# Patient Record
Sex: Female | Born: 1995 | Race: White | Hispanic: No | Marital: Single | Smoking: Never smoker
Health system: Southern US, Community
[De-identification: ages and names within clinical notes are randomized; demographics above are authoritative.]

## PROBLEM LIST (undated history)

## (undated) DIAGNOSIS — E282 Polycystic ovarian syndrome: Secondary | ICD-10-CM

## (undated) DIAGNOSIS — F419 Anxiety disorder, unspecified: Secondary | ICD-10-CM

## (undated) DIAGNOSIS — F32A Depression, unspecified: Secondary | ICD-10-CM

---

## 2019-09-12 ENCOUNTER — Ambulatory Visit: Payer: Self-pay | Attending: Internal Medicine

## 2021-01-08 ENCOUNTER — Emergency Department (HOSPITAL_COMMUNITY)
Admission: EM | Admit: 2021-01-08 | Discharge: 2021-01-08 | Disposition: A | Discharge status: Home / Self Care | Payer: 59 | Attending: Emergency Medicine | Admitting: Emergency Medicine

## 2021-01-08 ENCOUNTER — Encounter (HOSPITAL_COMMUNITY): Payer: Self-pay

## 2021-01-08 ENCOUNTER — Emergency Department (HOSPITAL_COMMUNITY): Payer: 59

## 2021-01-08 ENCOUNTER — Other Ambulatory Visit: Payer: Self-pay

## 2021-01-08 DIAGNOSIS — W109XXA Fall (on) (from) unspecified stairs and steps, initial encounter: Secondary | ICD-10-CM | POA: Diagnosis not present

## 2021-01-08 DIAGNOSIS — M25571 Pain in right ankle and joints of right foot: Secondary | ICD-10-CM | POA: Insufficient documentation

## 2021-01-08 HISTORY — DX: Anxiety disorder, unspecified: F41.9

## 2021-01-08 HISTORY — DX: Depression, unspecified: F32.A

## 2021-01-08 HISTORY — DX: Polycystic ovarian syndrome: E28.2

## 2021-01-08 NOTE — ED Triage Notes (Signed)
Patient reports that she stepped off of the steps today and heard a loud crack . Patient c/o right ankle pain. Patient states she was able to ambulate on it, but now it is too painful.

## 2021-01-08 NOTE — ED Notes (Signed)
Called ortho tech for crutches and cam boot

## 2021-01-08 NOTE — ED Provider Notes (Signed)
[facility]-EMERGENCY DEPT Provider Note   CSN: [id number] Arrival date & time: 01/08/21  1132     History Chief Complaint  Patient presents with  . Ankle Pain    Alexa Herrera is a 25 y.o. female with history of anxiety, depression, PCOS.  Patient presents with chief complaint of right ankle pain.  Patient reports that she was coming down a set of steps when she twisted her right ankle and heard a "crack."  Patient denies falling or hitting her head.  Patient reports that she was able to sit down after the fall.  She was ambulatory after the fall.  Patient endorses pain to her right ankle and foot.  Patient rates her pain 3/10 on pain scale.  Pain is worse with movement.  Patient denies any numbness, weakness, or color change to affected foot.   HPI     Past Medical History:  Diagnosis Date  . Anxiety   . Depression   . PCOS (polycystic ovarian syndrome)     There are no problems to display for this patient.   History reviewed. No pertinent surgical history.   OB History   No obstetric history on file.     Family History  Problem Relation Age of Onset  . Anxiety disorder Mother     Social History   Tobacco Use  . Smoking status: Never Smoker  . Smokeless tobacco: Never Used  Vaping Use  . Vaping Use: Every day  . Substances: Nicotine, Flavoring  Substance Use Topics  . Alcohol use: Never  . Drug use: Never    Home Medications Prior to Admission medications   Not on File    Allergies    Patient has no known allergies.  Review of Systems   Review of Systems  Musculoskeletal: Positive for arthralgias and joint swelling. Negative for back pain, gait problem, myalgias and neck pain.  Skin: Negative for color change, pallor and rash.  Neurological: Negative for weakness and numbness.    Physical Exam Updated Vital Signs BP (!) 120/93 (BP Location: Left Arm)   Pulse (!) 107   Temp 97.9 F (36.6 C) (Oral)   Resp 18   Ht 5'  9" (1.753 m)   Wt 113.4 kg   LMP 10/11/2020 (Approximate) Comment: irregular periods  SpO2 98%   BMI 36.92 kg/m   Physical Exam Vitals and nursing note reviewed.  Constitutional:      General: She is not in acute distress.    Appearance: She is not ill-appearing, toxic-appearing or diaphoretic.  HENT:     Head: Normocephalic.  Eyes:     General: No scleral icterus.       Right eye: No discharge.        Left eye: No discharge.  Cardiovascular:     Rate and Rhythm: Normal rate.  Pulmonary:     Effort: Pulmonary effort is normal. No respiratory distress.     Breath sounds: No stridor.  Musculoskeletal:     Right ankle: No swelling, deformity, ecchymosis or lacerations. Tenderness present. Normal range of motion. Normal pulse.     Right Achilles Tendon: Normal.     Left ankle: No swelling, deformity, ecchymosis or lacerations. No tenderness. Normal range of motion. Normal pulse.     Left Achilles Tendon: Normal.     Right foot: Normal range of motion and normal capillary refill. Tenderness present. No swelling, deformity, laceration or bony tenderness. Normal pulse.     Left foot: Normal range  of motion and normal capillary refill. No swelling, deformity, laceration, tenderness or bony tenderness. Normal pulse.     Comments: Patient has tenderness to right anterior ankle as well as dorsum of right foot.  Minimal swelling noted to lateral aspect of right ankle.  Patient has +5 strength to dorsiflexion and plantar flexion bilaterally.  +3 dorsalis pedis pulse bilaterally.  Sensation intact to all digits of right foot.  Cap refill less than 2 seconds in all digits of right foot.  Skin:    General: Skin is warm and dry.  Neurological:     General: No focal deficit present.     Mental Status: She is alert.  Psychiatric:        Behavior: Behavior is cooperative.     ED Results / Procedures / Treatments   Labs (all labs ordered are listed, but only abnormal results are  displayed) Labs Reviewed - No data to display  EKG None  Radiology DG Ankle Complete Right  Result Date: 01/08/2021 CLINICAL DATA:  Right foot and ankle pain. EXAM: RIGHT ANKLE - COMPLETE 3+ VIEW COMPARISON:  None. FINDINGS: Mild lateral soft tissue swelling. No visible fracture or dislocation IMPRESSION: Mild lateral soft tissue swelling.  Otherwise negative. Electronically Signed   By: Paulina Fusi M.D.   On: 01/08/2021 13:31   DG Foot Complete Right  Result Date: 01/08/2021 CLINICAL DATA:  Twisting injury today.  Pain. EXAM: RIGHT FOOT COMPLETE - 3+ VIEW COMPARISON:  None. FINDINGS: There is no evidence of fracture or dislocation. There is no evidence of arthropathy or other focal bone abnormality. Soft tissues are unremarkable. IMPRESSION: Normal radiographs. Electronically Signed   By: Paulina Fusi M.D.   On: 01/08/2021 13:32    Procedures Procedures   Medications Ordered in ED Medications - No data to display  ED Course  I have reviewed the triage vital signs and the nursing notes.  Pertinent labs & imaging results that were available during my care of the patient were reviewed by me and considered in my medical decision making (see chart for details).    MDM Rules/Calculators/A&P                          Alert 25 year old female no acute distress, nontoxic-appearing.  Patient presents with chief complaint of right ankle and foot pain.  Patient reports that she was coming down a set of steps when she rolled her ankle and heard "a crack.".  On physical exam patient has minimal swelling to lateral aspect of her ankle.  Pulse, motor, and sensation intact to right foot.  No color change, wound, or ecchymosis observed.  Will give patient Percocet for pain management.  Will obtain x-ray imaging of right foot and ankle.  X-ray imaging of right foot and ankle shows no acute fracture or dislocation.  Will place patient in cam walking boot, give her crutches, and have her follow-up  with orthopedic physician.  Patient advised to rest, elevate, and ice ankle to reduce swelling.  Discussed results, findings, treatment and follow up. Patient advised of return precautions. Patient verbalized understanding and agreed with plan.   Final Clinical Impression(s) / ED Diagnoses Final diagnoses:  Acute right ankle pain    Rx / DC Orders ED Discharge Orders    None       Haskel Schroeder, PA-C 01/09/21 0038    Mancel Bale, MD 01/09/21 [phone]

## 2021-01-08 NOTE — Progress Notes (Signed)
Orthopedic Tech Progress Note Patient Details:  Alexa Herrera Nov 21, 1995 [id number]  Ortho Devices Type of Ortho Device: Crutches,CAM walker Ortho Device/Splint Location: right Ortho Device/Splint Interventions: Application   Post Interventions Patient Tolerated: Well Instructions Provided: Care of device   Saul Fordyce 01/08/2021, 2:00 PM

## 2021-01-08 NOTE — Discharge Instructions (Signed)
You came to the emerge department today to be evaluated for your right ankle and foot pain.  Your x-ray showed no broken bones or dislocations.  I have placed you in a cam walking boot and given you crutches.  Please stay off of your foot as much as possible.  Please elevate and ice your foot to reduce swelling.  Please follow-up with orthopedic surgeon.  Please take Ibuprofen (Advil, motrin) and Tylenol (acetaminophen) to relieve your pain.    You may take up to 600 MG (3 pills) of normal strength ibuprofen every 8 hours as needed.   You make take tylenol, up to 1,000 mg (two extra strength pills) every 8 hours as needed.   It is safe to take ibuprofen and tylenol at the same time as they work differently.   Do not take more than 3,000 mg tylenol in a 24 hour period (not more than one dose every 8 hours.  Please check all medication labels as many medications such as pain and cold medications may contain tylenol.  Do not drink alcohol while taking these medications.  Do not take other NSAID'S while taking ibuprofen (such as aleve or naproxen).  Please take ibuprofen with food to decrease stomach upset.  Today you received medications that may make you sleepy or impair your ability to make decisions.  For the next 24 hours please do not drive, operate heavy machinery, care for a small child with out another adult present, or perform any activities that may cause harm to you or someone else if you were to fall asleep or be impaired.    Get help right away if: Your foot, leg, toes, or ankle: Becomes numb. Becomes swollen. Turns pale or blue.

## 2021-01-08 NOTE — ED Provider Notes (Signed)
Emergency Medicine Provider Triage Evaluation Note  Alexa Herrera , a 25 y.o. female  was evaluated in triage.  Pt complains of stepped off of a stair, twisted right ankle and heard a "crack."  Patient was ambulatory after injury.     Review of Systems  Positive: Right ankle pain Negative: Numbness, weakness   Physical Exam  BP (!) 120/93 (BP Location: Left Arm)   Pulse (!) 107   Temp 97.9 F (36.6 C) (Oral)   Resp 18   Ht 5\' 9"  (1.753 m)   Wt 113.4 kg   SpO2 98%   BMI 36.92 kg/m  Gen:   Awake, no distress   Resp:  Normal effort  MSK:   Moves extremities without difficulty  Other:  PMS intact to right foot  Medical Decision Making  Medically screening exam initiated at 12:37 PM.  Appropriate orders placed.  Alexa Herrera was informed that the remainder of the evaluation will be completed by another provider, this initial triage assessment does not replace that evaluation, and the importance of remaining in the ED until their evaluation is complete.  The patient appears stable so that the remainder of the work up may be completed by another provider.      Gearldine Shown, PA-C 01/08/21 1241    03/10/21, MD 01/08/21 [phone]

## 2022-07-14 IMAGING — CR DG FOOT COMPLETE 3+V*R*
3 series · 3 of 3 positions shown · non-contrast
Comparison: None.

CLINICAL DATA: Twisting injury today.  Pain.

EXAM:
RIGHT FOOT COMPLETE - 3+ VIEW

[x foot ap right]
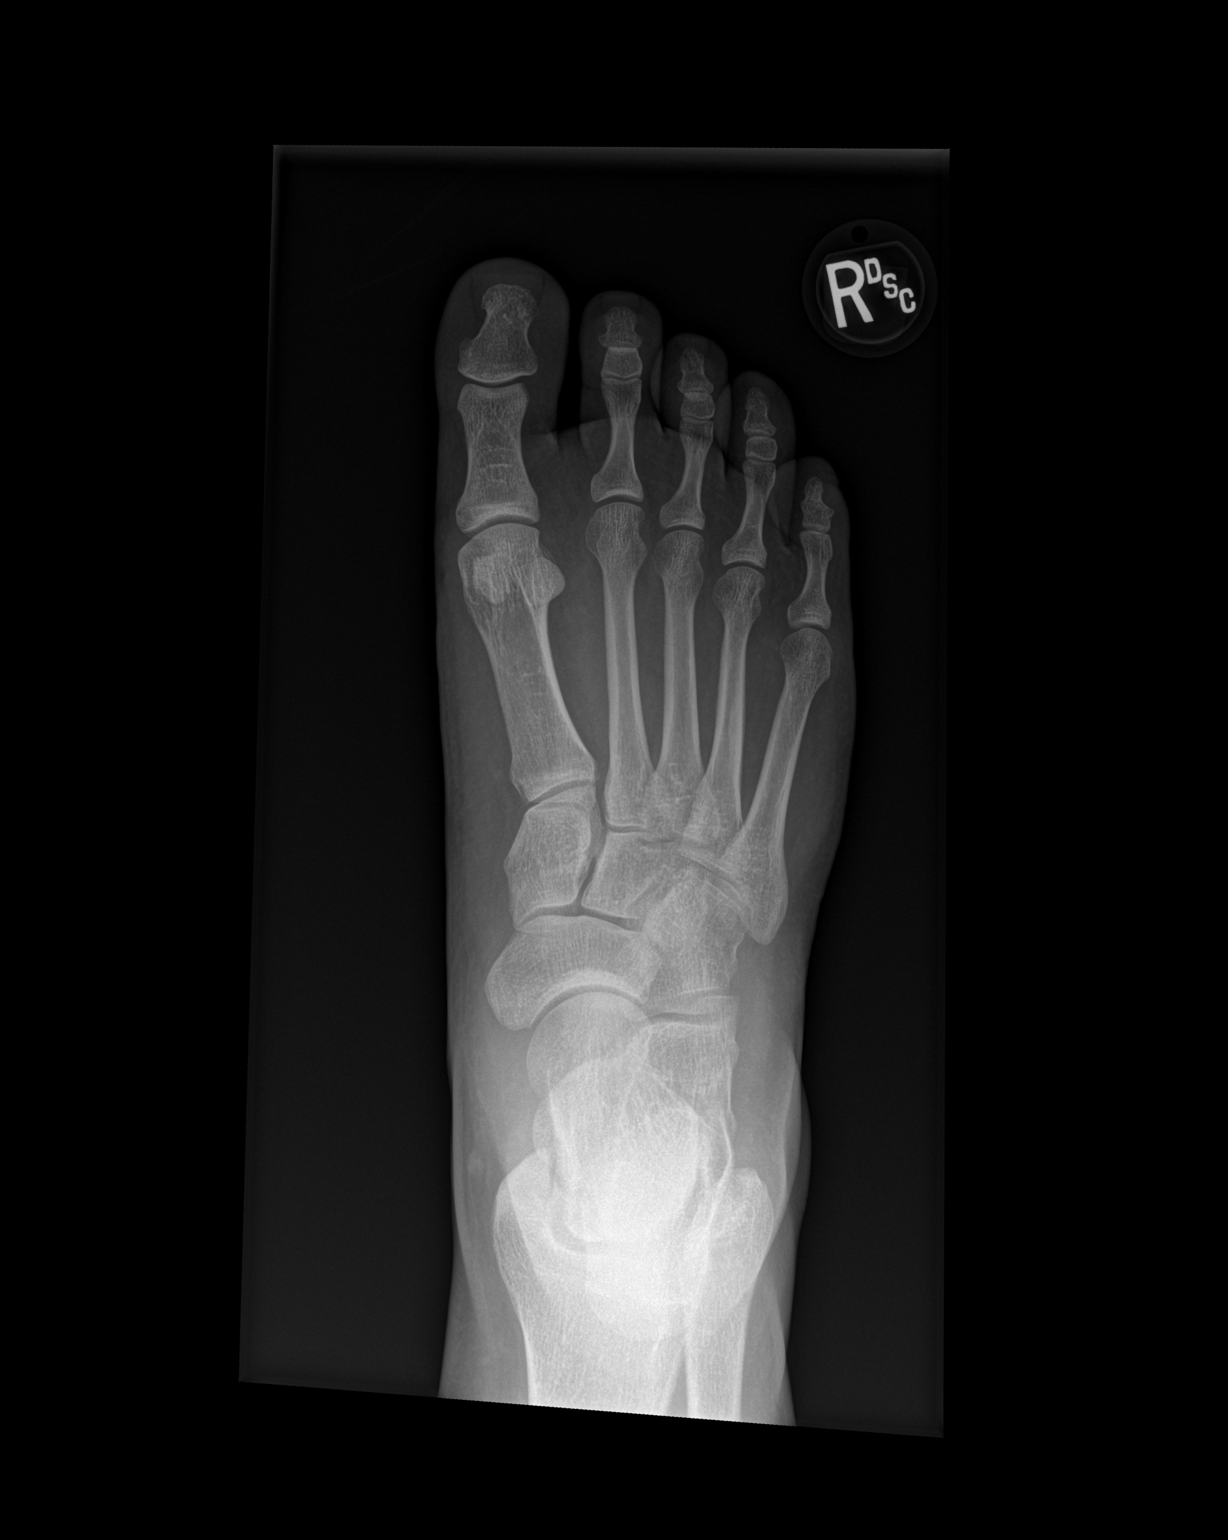

[x foot obl right]
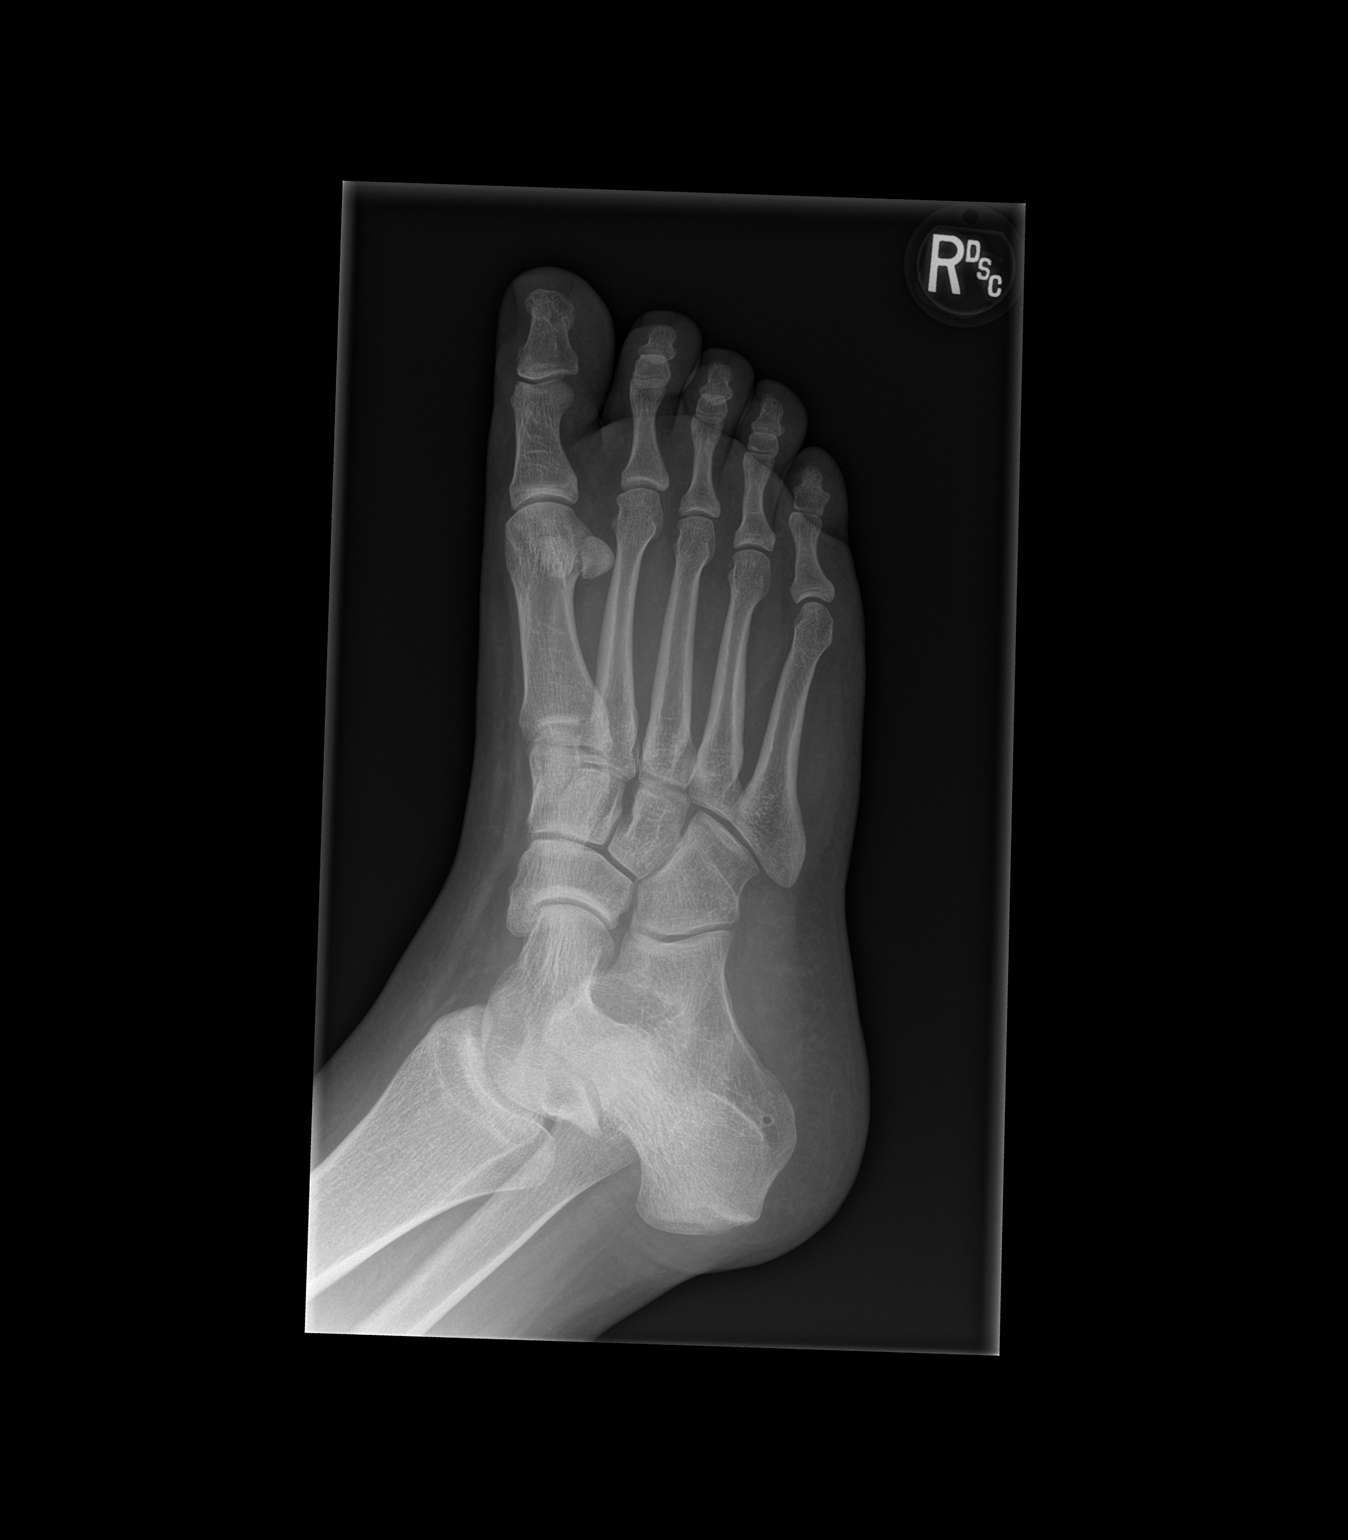

[x foot lat right]
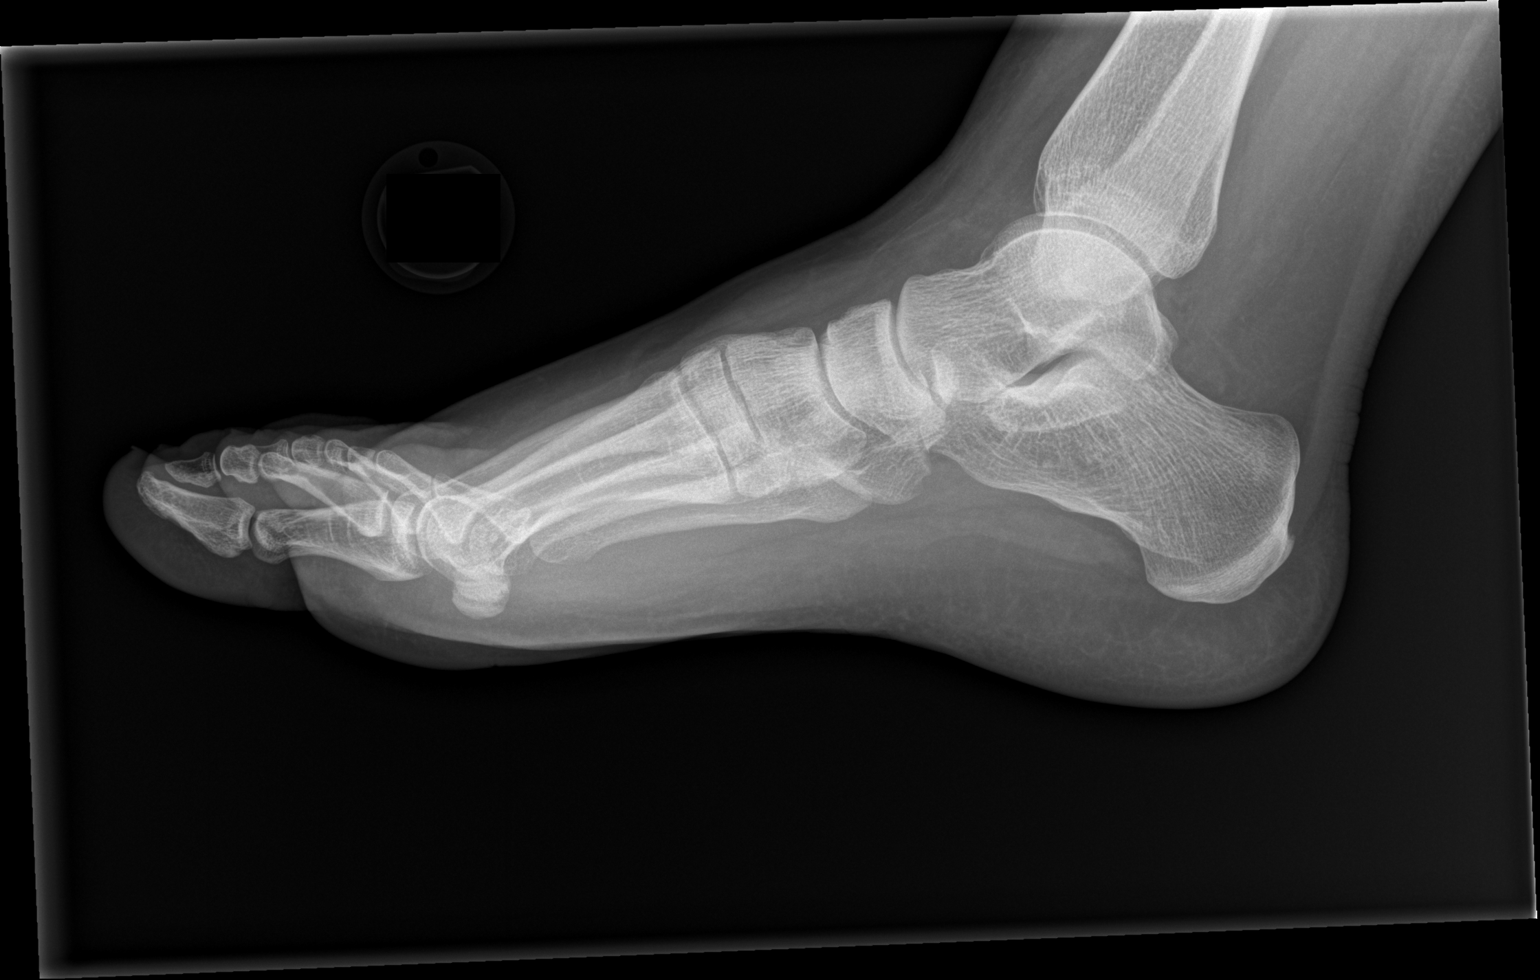

[3 of 3 positions shown; findings below may reference images not displayed]

FINDINGS: There is no evidence of fracture or dislocation. There is no
evidence of arthropathy or other focal bone abnormality. Soft
tissues are unremarkable.
IMPRESSION: Normal radiographs.

## 2022-07-14 IMAGING — CR DG ANKLE COMPLETE 3+V*R*
3 series · 3 of 3 positions shown · non-contrast
Comparison: None.

CLINICAL DATA: Right foot and ankle pain.

EXAM:
RIGHT ANKLE - COMPLETE 3+ VIEW

[x ankle lat right]
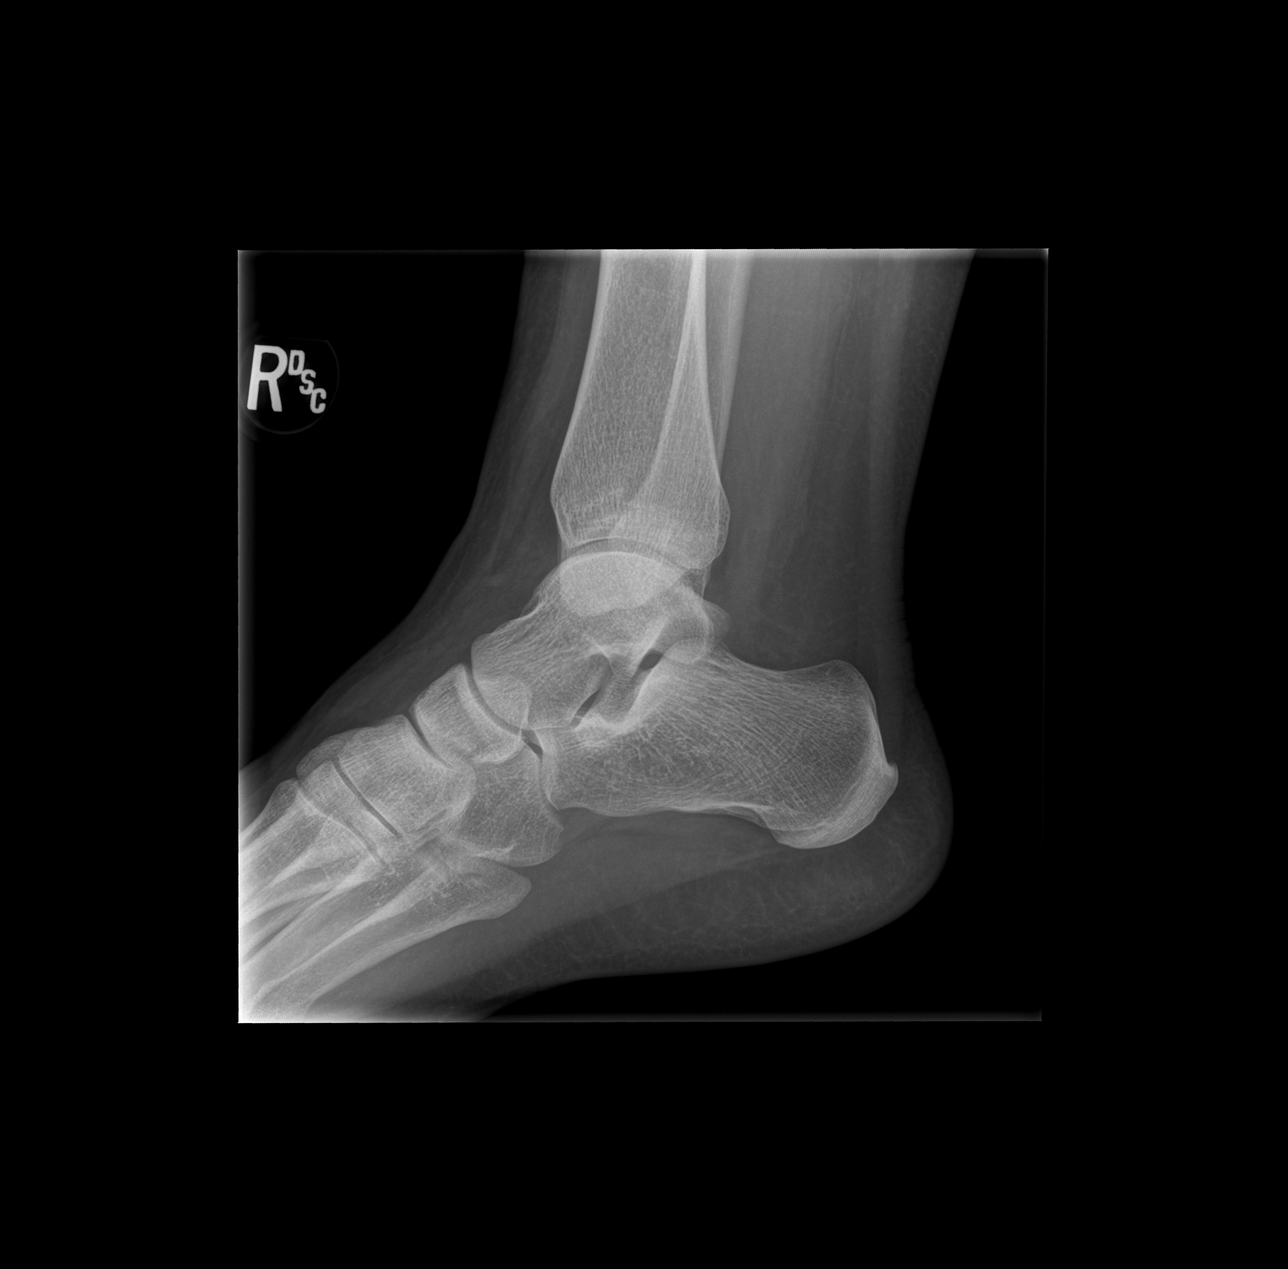

[x ankle ap right]
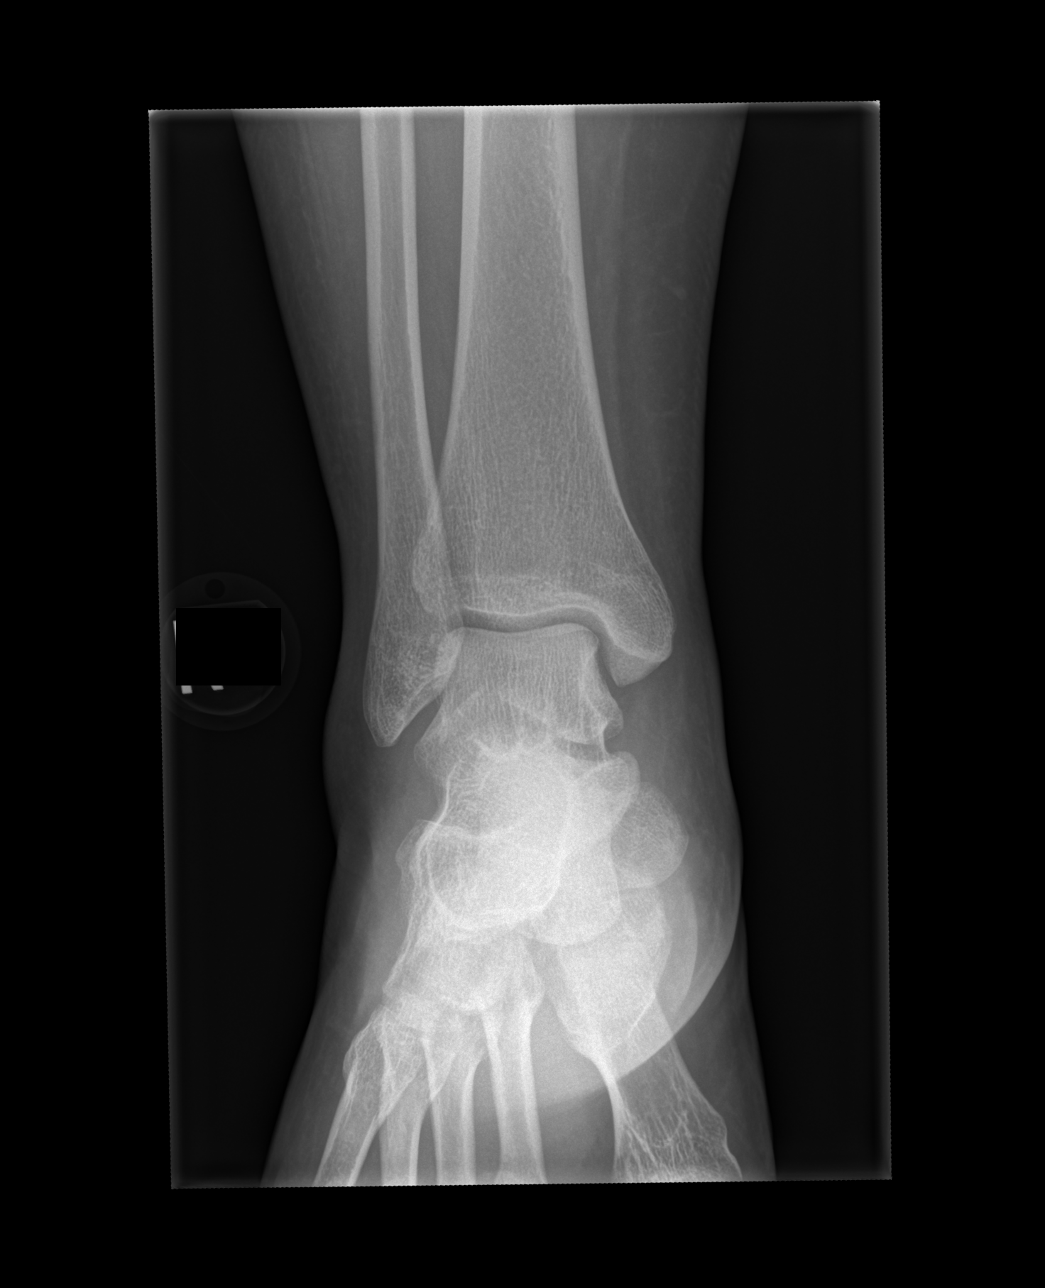

[x ankle obl right]
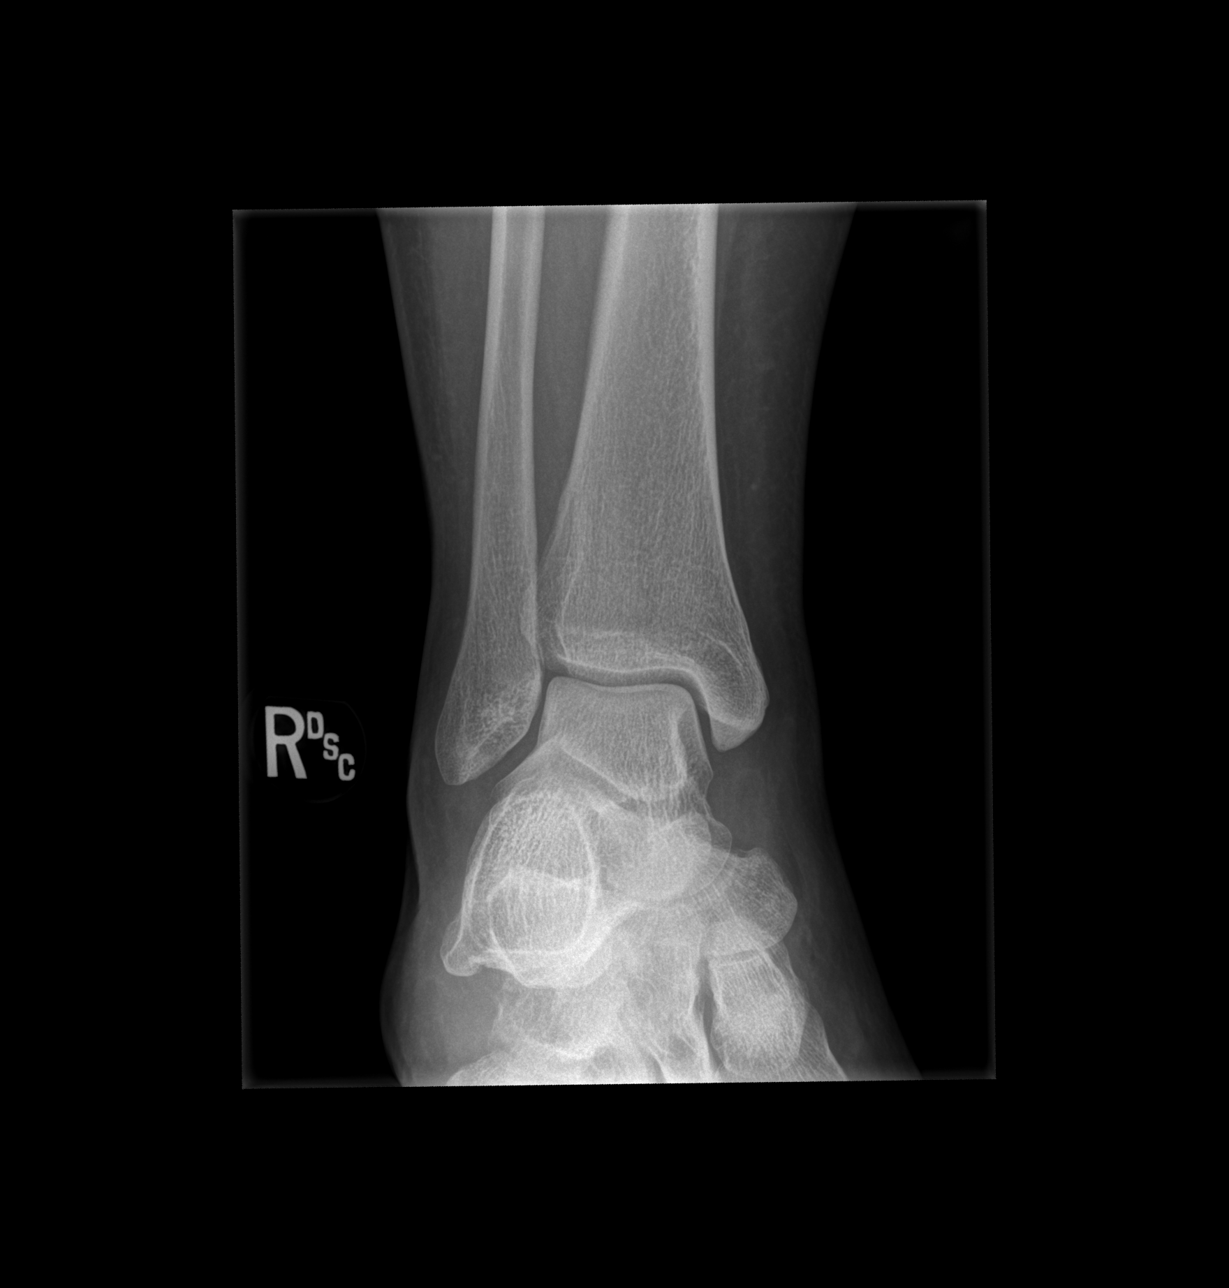

[3 of 3 positions shown; findings below may reference images not displayed]

FINDINGS: Mild lateral soft tissue swelling. No visible fracture or
dislocation
IMPRESSION: Mild lateral soft tissue swelling.  Otherwise negative.
